# Patient Record
Sex: Female | Born: 1960 | Race: White | Hispanic: No | Marital: Married | Smoking: Never smoker
Health system: Southern US, Community
[De-identification: ages and names within clinical notes are randomized; demographics above are authoritative.]

## PROBLEM LIST (undated history)

## (undated) DIAGNOSIS — D352 Benign neoplasm of pituitary gland: Secondary | ICD-10-CM

## (undated) DIAGNOSIS — M549 Dorsalgia, unspecified: Secondary | ICD-10-CM

## (undated) HISTORY — PX: BREAST BIOPSY: SHX20

---

## 2012-08-26 HISTORY — PX: BREAST BIOPSY: SHX20

## 2012-09-30 ENCOUNTER — Other Ambulatory Visit: Payer: Self-pay | Admitting: Family Medicine

## 2012-09-30 DIAGNOSIS — Z1231 Encounter for screening mammogram for malignant neoplasm of breast: Secondary | ICD-10-CM

## 2012-10-29 ENCOUNTER — Ambulatory Visit
Admission: RE | Admit: 2012-10-29 | Discharge: 2012-10-29 | Disposition: A | Discharge status: Home / Self Care | Payer: 59 | Source: Ambulatory Visit | Attending: Family Medicine | Admitting: Family Medicine

## 2012-10-29 DIAGNOSIS — Z1231 Encounter for screening mammogram for malignant neoplasm of breast: Secondary | ICD-10-CM

## 2012-11-03 ENCOUNTER — Other Ambulatory Visit: Payer: Self-pay | Admitting: Family Medicine

## 2012-11-03 DIAGNOSIS — R928 Other abnormal and inconclusive findings on diagnostic imaging of breast: Secondary | ICD-10-CM

## 2012-11-12 ENCOUNTER — Ambulatory Visit
Admission: RE | Admit: 2012-11-12 | Discharge: 2012-11-12 | Disposition: A | Discharge status: Home / Self Care | Payer: 59 | Source: Ambulatory Visit | Attending: Family Medicine | Admitting: Family Medicine

## 2012-11-12 DIAGNOSIS — R928 Other abnormal and inconclusive findings on diagnostic imaging of breast: Secondary | ICD-10-CM

## 2013-04-16 ENCOUNTER — Other Ambulatory Visit: Payer: Self-pay | Admitting: Family Medicine

## 2013-04-16 DIAGNOSIS — N6459 Other signs and symptoms in breast: Secondary | ICD-10-CM

## 2013-05-04 ENCOUNTER — Other Ambulatory Visit: Payer: Self-pay | Admitting: Family Medicine

## 2013-05-04 ENCOUNTER — Ambulatory Visit
Admission: RE | Admit: 2013-05-04 | Discharge: 2013-05-04 | Disposition: A | Discharge status: Home / Self Care | Payer: 59 | Source: Ambulatory Visit | Attending: Family Medicine | Admitting: Family Medicine

## 2013-05-04 DIAGNOSIS — N6459 Other signs and symptoms in breast: Secondary | ICD-10-CM

## 2013-05-07 ENCOUNTER — Ambulatory Visit
Admission: RE | Admit: 2013-05-07 | Discharge: 2013-05-07 | Disposition: A | Discharge status: Home / Self Care | Payer: 59 | Source: Ambulatory Visit | Attending: Family Medicine | Admitting: Family Medicine

## 2013-05-07 ENCOUNTER — Other Ambulatory Visit: Payer: Self-pay | Admitting: Family Medicine

## 2013-05-07 DIAGNOSIS — N6459 Other signs and symptoms in breast: Secondary | ICD-10-CM

## 2013-05-14 ENCOUNTER — Other Ambulatory Visit: Payer: 59

## 2014-05-13 ENCOUNTER — Emergency Department (HOSPITAL_BASED_OUTPATIENT_CLINIC_OR_DEPARTMENT_OTHER)
Admission: EM | Admit: 2014-05-13 | Discharge: 2014-05-13 | Disposition: A | Discharge status: Home / Self Care | Payer: 59 | Attending: Emergency Medicine | Admitting: Emergency Medicine

## 2014-05-13 ENCOUNTER — Encounter (HOSPITAL_BASED_OUTPATIENT_CLINIC_OR_DEPARTMENT_OTHER): Payer: Self-pay | Admitting: Emergency Medicine

## 2014-05-13 DIAGNOSIS — T424X5A Adverse effect of benzodiazepines, initial encounter: Secondary | ICD-10-CM | POA: Diagnosis not present

## 2014-05-13 DIAGNOSIS — T450X5A Adverse effect of antiallergic and antiemetic drugs, initial encounter: Secondary | ICD-10-CM | POA: Insufficient documentation

## 2014-05-13 DIAGNOSIS — T7840XA Allergy, unspecified, initial encounter: Secondary | ICD-10-CM

## 2014-05-13 DIAGNOSIS — L299 Pruritus, unspecified: Secondary | ICD-10-CM | POA: Insufficient documentation

## 2014-05-13 DIAGNOSIS — Z79899 Other long term (current) drug therapy: Secondary | ICD-10-CM | POA: Insufficient documentation

## 2014-05-13 HISTORY — DX: Dorsalgia, unspecified: M54.9

## 2014-05-13 HISTORY — DX: Benign neoplasm of pituitary gland: D35.2

## 2014-05-13 MED ORDER — FAMOTIDINE 20 MG PO TABS: 20.0000 mg | ORAL_TABLET | Freq: Two times a day (BID) | ORAL | Status: AC

## 2014-05-13 NOTE — ED Notes (Signed)
Patient states she was at the sedation dentist office for an appointment at 0700.  States she was given oral medication prior to the beginning of there appointment (Triazolam & Hydroxyzine).  States she was then given IV sedation of of midazolam 1 mg x 2, followed by diazepam 5mg  at 0745.  At [place], pt developed itching and was given benadryl 50mg .  Dental procedure was continued with the use of carbocaine until 0843, when the itching intensified and pt was treated with epinephrine 0.3mg  IM, then followed by hydrocortisone 10mg  iv.  Patient is drowsy, oriented x 4, denies pain, and states that the itching is greatly improved.

## 2014-05-13 NOTE — ED Notes (Signed)
Per EMS:  Pt had reaction to valium at dentist office.  Per dentist, pt had decreased respirations and decreased sats at office.  Pt given Benadryl, Epinephrine, and Hydrocortisone in office.  No airway impairment.

## 2014-05-13 NOTE — ED Provider Notes (Signed)
CSN: [id number]     Arrival date & time 05/13/14  0814 History   First MD Initiated Contact with Patient 05/13/14 1012     Chief Complaint  Patient presents with  . Allergic Reaction     (Consider location/radiation/quality/duration/timing/severity/associated sxs/prior Treatment) Patient is a 53 y.o. female presenting with allergic reaction. The history is provided by the patient, the EMS personnel and the spouse.  Allergic Reaction Presenting symptoms: no difficulty swallowing and no rash    patient brought in by EMS from sedation data office. The patient was undergoing that procedure root canal and preparation of a tooth for crown. Patient started to have itching there've was no rash no tongue lip swelling no trouble breathing. Dentist administered hydrocortisone Benadryl and epinephrine. Patient no longer has any itching. Patient denies any trouble breathing. Patient is still slightly sedated. Patient's husband is with her.  Past Medical History  Diagnosis Date  . Pituitary adenoma   . Back pain    History reviewed. No pertinent past surgical history. No family history on file. History  Substance Use Topics  . Smoking status: Never Smoker   . Smokeless tobacco: Not on file  . Alcohol Use: Yes     Comment: occassionally   OB History   Grav Para Term Preterm Abortions TAB SAB Ect Mult Living                 Review of Systems  Constitutional: Negative for fever.  HENT: Negative for congestion and trouble swallowing.   Eyes: Negative for redness.  Respiratory: Negative for shortness of breath.   Cardiovascular: Negative for chest pain.  Gastrointestinal: Negative for nausea, vomiting and abdominal pain.  Skin: Negative for rash.  Neurological: Negative for headaches.  Psychiatric/Behavioral: Negative for confusion.      Allergies  Valium  Home Medications   Prior to Admission medications   Medication Sig Start Date End Date Taking? Authorizing Provider   cabergoline (DOSTINEX) 0.5 MG tablet Take 0.25 mg by mouth 2 (two) times a week.   Yes Historical Provider, MD  famotidine (PEPCID) 20 MG tablet Take 1 tablet (20 mg total) by mouth 2 (two) times daily. 05/13/14   Fredia Sorrow, MD   BP 138/70  Pulse 80  Temp(Src) 97.8 F (36.6 C) (Oral)  Resp 16  Ht 5\' 7"  (1.702 m)  Wt 199 lb (90.266 kg)  BMI 31.16 kg/m2  SpO2 96%  LMP 04/15/2014 Physical Exam  Nursing note and vitals reviewed. Constitutional: She is oriented to person, place, and time. She appears well-developed and well-nourished. No distress.  HENT:  Head: Normocephalic and atraumatic.  Mouth/Throat: Oropharynx is clear and moist. No oropharyngeal exudate.  Eyes: Conjunctivae and EOM are normal. Pupils are equal, round, and reactive to light.  Neck: Normal range of motion. Neck supple.  Cardiovascular: Normal rate, regular rhythm and normal heart sounds.   No murmur heard. Pulmonary/Chest: Effort normal and breath sounds normal.  Abdominal: Soft. Bowel sounds are normal. There is no tenderness.  Musculoskeletal: Normal range of motion. She exhibits no edema.  Neurological: She is oriented to person, place, and time. No cranial nerve deficit. She exhibits normal muscle tone. Coordination normal.  Mild sedation however will wake up and answer all questions appropriately. Will follow all commands. Eyes open spontaneously. Glasgow coma scale would be 15.  Skin: Skin is warm. No rash noted. No erythema.    ED Course  Procedures (including critical care time) Labs Review Labs Reviewed - No data to display  Imaging Review No results found.   EKG Interpretation None      MDM   Final diagnoses:  Allergic reaction, initial encounter    Patient referred in from sedation dentist office. Patient was having a root canal and the tooth prepped for crown. Patient started itching. Patient was undergoing some IV sedation had the basilar and had diazepam also had.Trimox Lam and  hydroxyzine. Not sure exactly what caused your reaction. There never was any hives but never was any tongue or lip swelling. There never was any significant trouble breathing.  Appropriately the dentist administered Benadryl and hydrocortisone in the office IV. Patient also received epinephrine. Patient will be continued on Pepcid and Benadryl.  Clinically sounds as if it was a mild allergic reaction to what specific medication is not clear.    Fredia Sorrow, MD 05/13/14 [phone]

## 2014-05-13 NOTE — Discharge Instructions (Signed)
Take Benadryl 25 mg every 6 hours for the next 24 hours. Take the Pepcid twice a day as prescribed. Follow back up with your dentist as scheduled for the rest of the dental work. Return here for any tongue swelling lip swelling trouble breathing any worsening symptoms at all.

## 2014-11-10 ENCOUNTER — Other Ambulatory Visit: Payer: Self-pay | Admitting: Obstetrics and Gynecology

## 2014-11-11 LAB — CYTOLOGY - PAP

## 2018-06-04 ENCOUNTER — Other Ambulatory Visit: Payer: Self-pay | Admitting: Obstetrics and Gynecology

## 2018-06-04 DIAGNOSIS — N6459 Other signs and symptoms in breast: Secondary | ICD-10-CM

## 2018-06-08 ENCOUNTER — Ambulatory Visit (HOSPITAL_BASED_OUTPATIENT_CLINIC_OR_DEPARTMENT_OTHER): Payer: 59

## 2018-06-08 ENCOUNTER — Ambulatory Visit
Admission: RE | Admit: 2018-06-08 | Discharge: 2018-06-08 | Disposition: A | Discharge status: Home / Self Care | Payer: 59 | Source: Ambulatory Visit | Attending: Obstetrics and Gynecology | Admitting: Obstetrics and Gynecology

## 2018-06-08 ENCOUNTER — Other Ambulatory Visit: Payer: Self-pay | Admitting: Obstetrics and Gynecology

## 2018-06-08 DIAGNOSIS — N6459 Other signs and symptoms in breast: Secondary | ICD-10-CM

## 2019-08-24 ENCOUNTER — Other Ambulatory Visit: Payer: Self-pay | Admitting: Obstetrics and Gynecology

## 2019-08-24 DIAGNOSIS — N6453 Retraction of nipple: Secondary | ICD-10-CM

## 2020-08-10 ENCOUNTER — Other Ambulatory Visit: Payer: Self-pay | Admitting: Obstetrics and Gynecology

## 2020-08-10 DIAGNOSIS — N644 Mastodynia: Secondary | ICD-10-CM

## 2020-09-21 ENCOUNTER — Ambulatory Visit
Admission: RE | Admit: 2020-09-21 | Discharge: 2020-09-21 | Disposition: A | Discharge status: Home / Self Care | Payer: 59 | Source: Ambulatory Visit | Attending: Obstetrics and Gynecology | Admitting: Obstetrics and Gynecology

## 2020-09-21 ENCOUNTER — Other Ambulatory Visit: Payer: Self-pay

## 2020-09-21 DIAGNOSIS — N644 Mastodynia: Secondary | ICD-10-CM

## 2022-03-28 IMAGING — MG DIGITAL DIAGNOSTIC BILAT W/ TOMO W/ CAD
6 of 10 series · 6 of 30 positions shown · non-contrast
Comparison: Previous exam(s).

CLINICAL DATA: Patient presents with diffuse inferior and lateral
right breast pain and also reports a lump along the inferior aspect
of the right breast. She has bilateral nipple retraction which she
believes has increased. No reported nipple discharge.

EXAM:
DIGITAL DIAGNOSTIC BILATERAL MAMMOGRAM WITH CAD AND TOMOSYNTHESIS
ULTRASOUND RIGHT BREAST
TECHNIQUE: Bilateral digital diagnostic mammography and breast tomosynthesis
was performed. Digital images of the breasts were evaluated with
computer-aided detection. Targeted ultrasound examination of the
Right breast was performed.

[L MLO synth-2D]
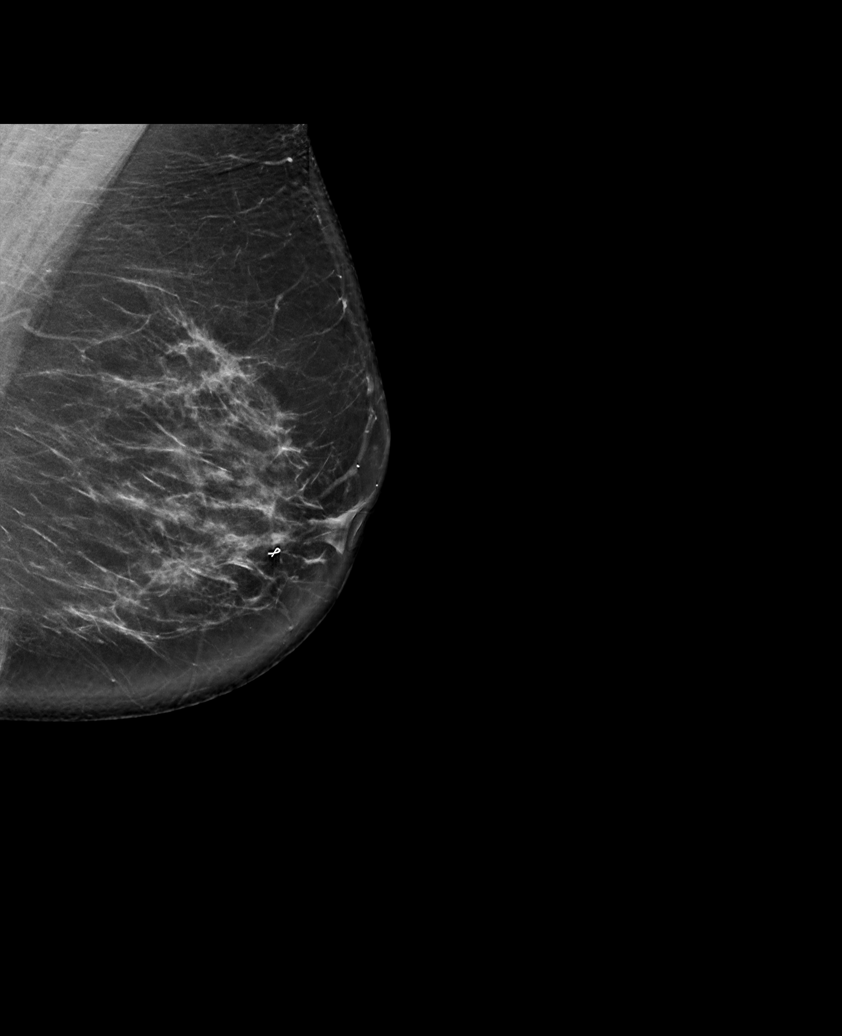

[R TAN synth-2D]
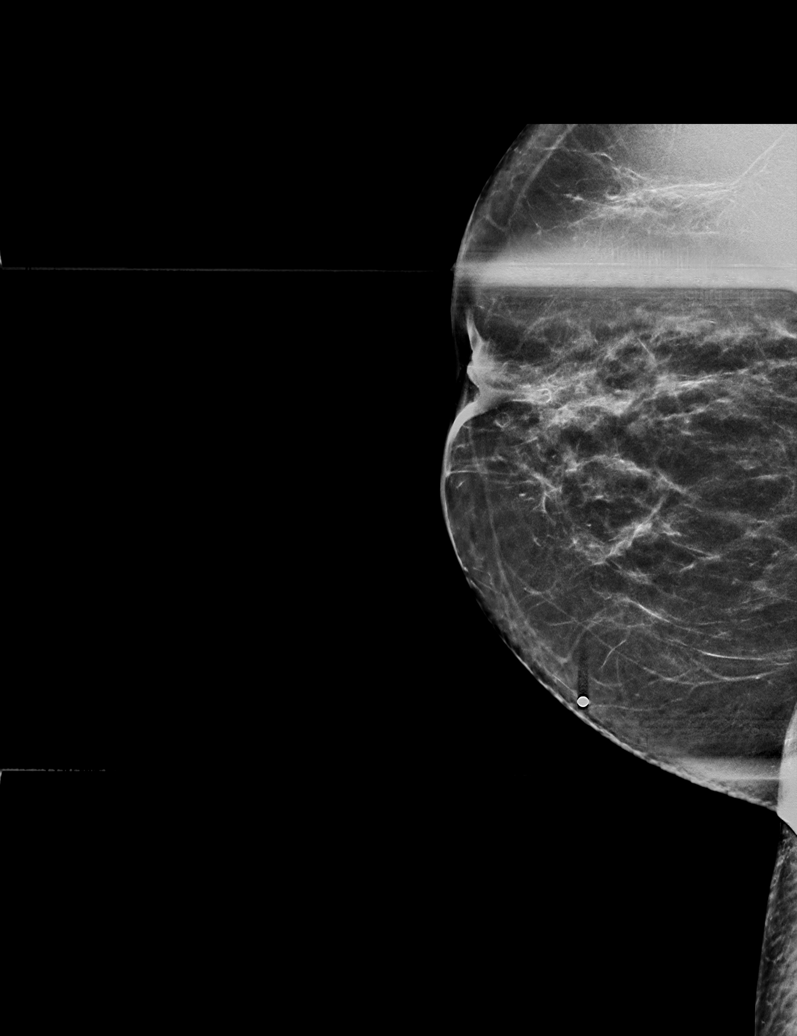

[L CC synth-2D]
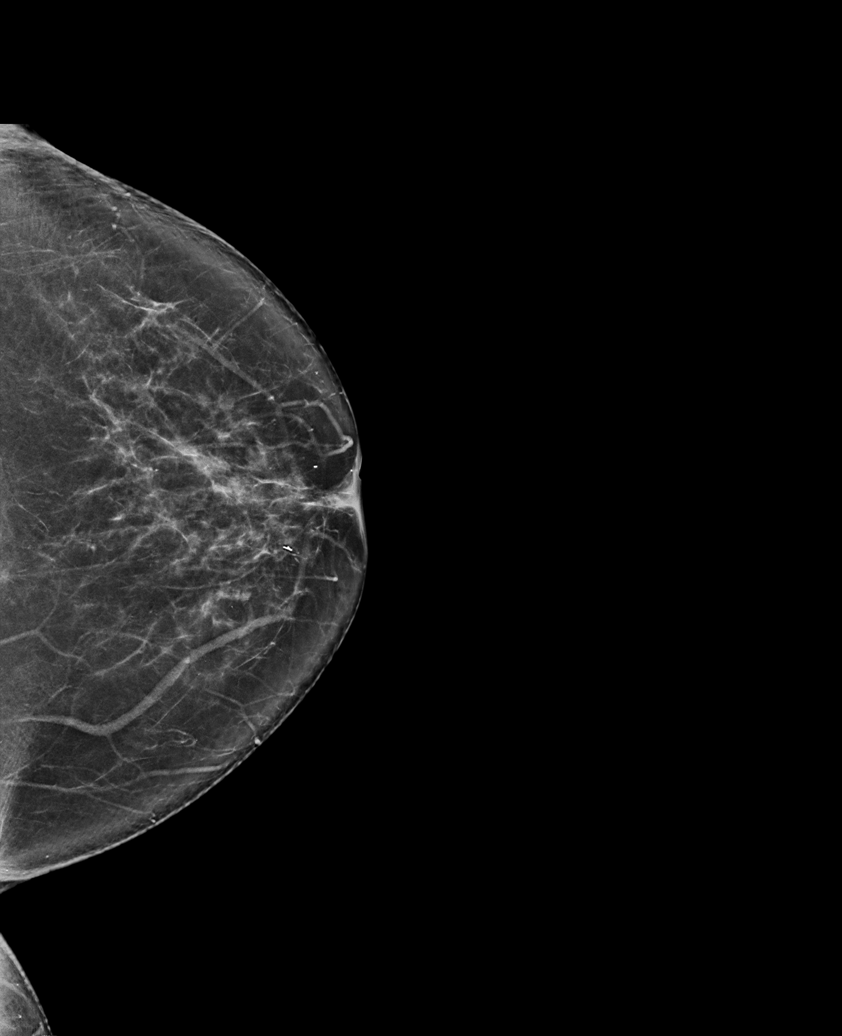

[R CC synth-2D]
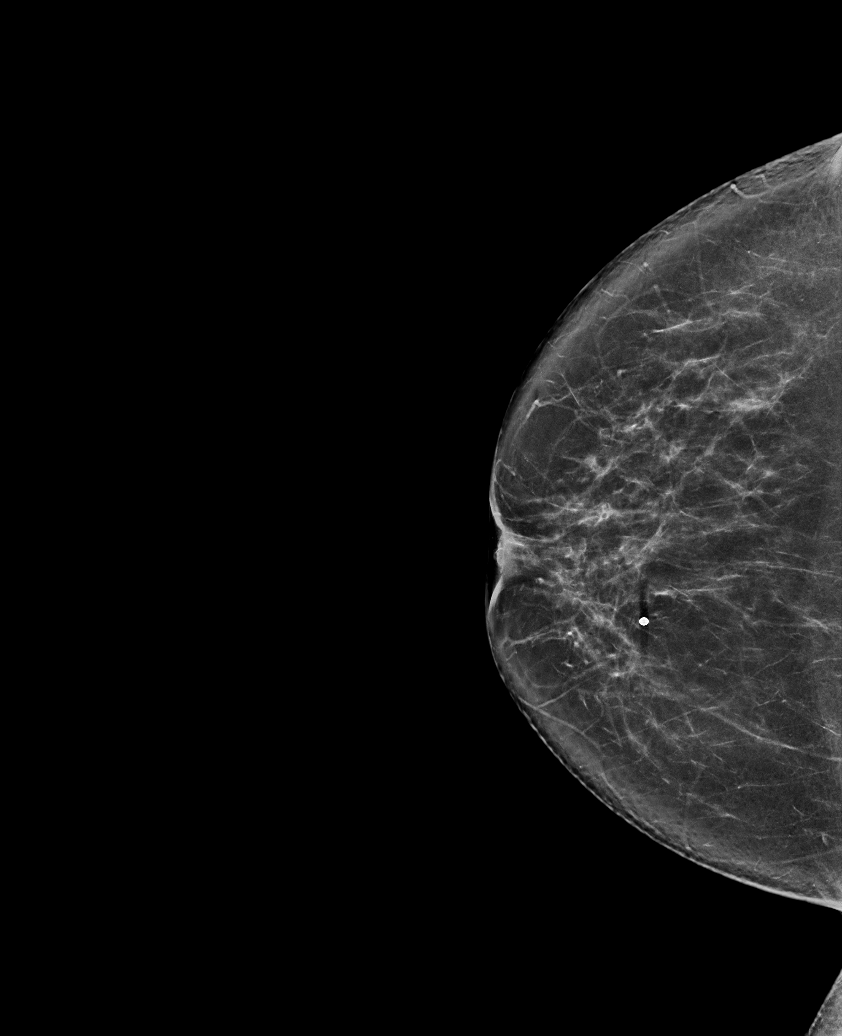

[R MLO synth-2D]
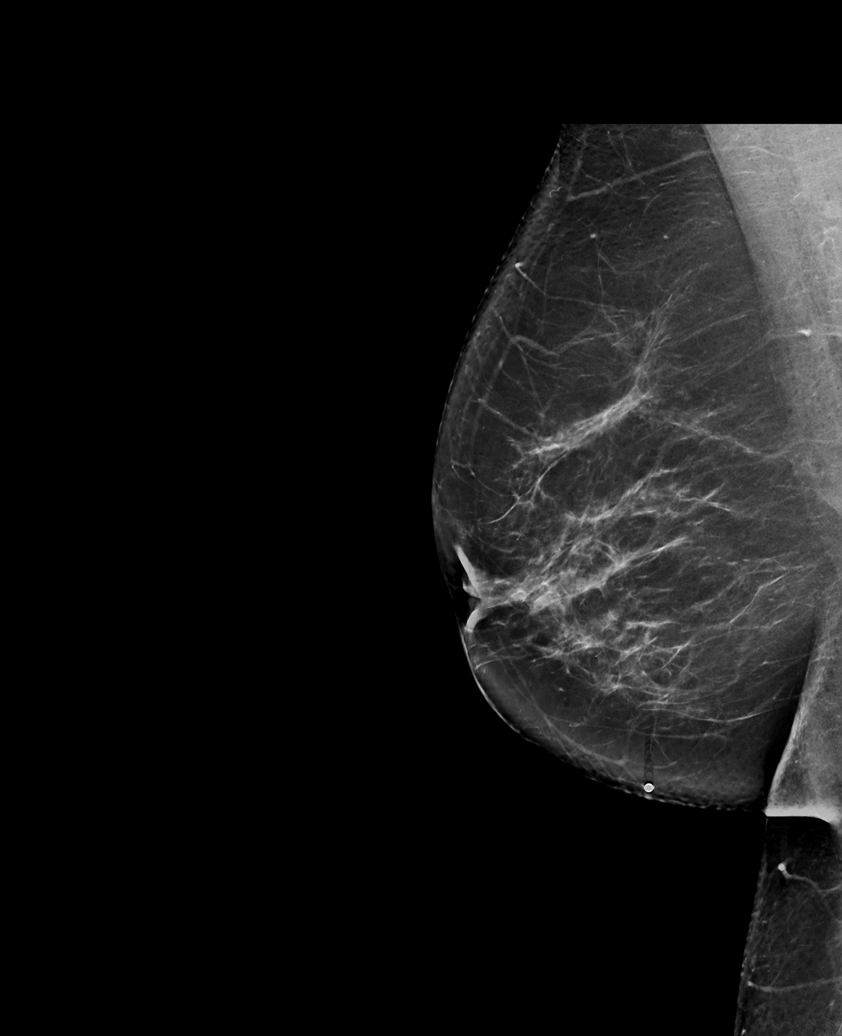

[R MLO tomo · tomo slice 46/91.0]
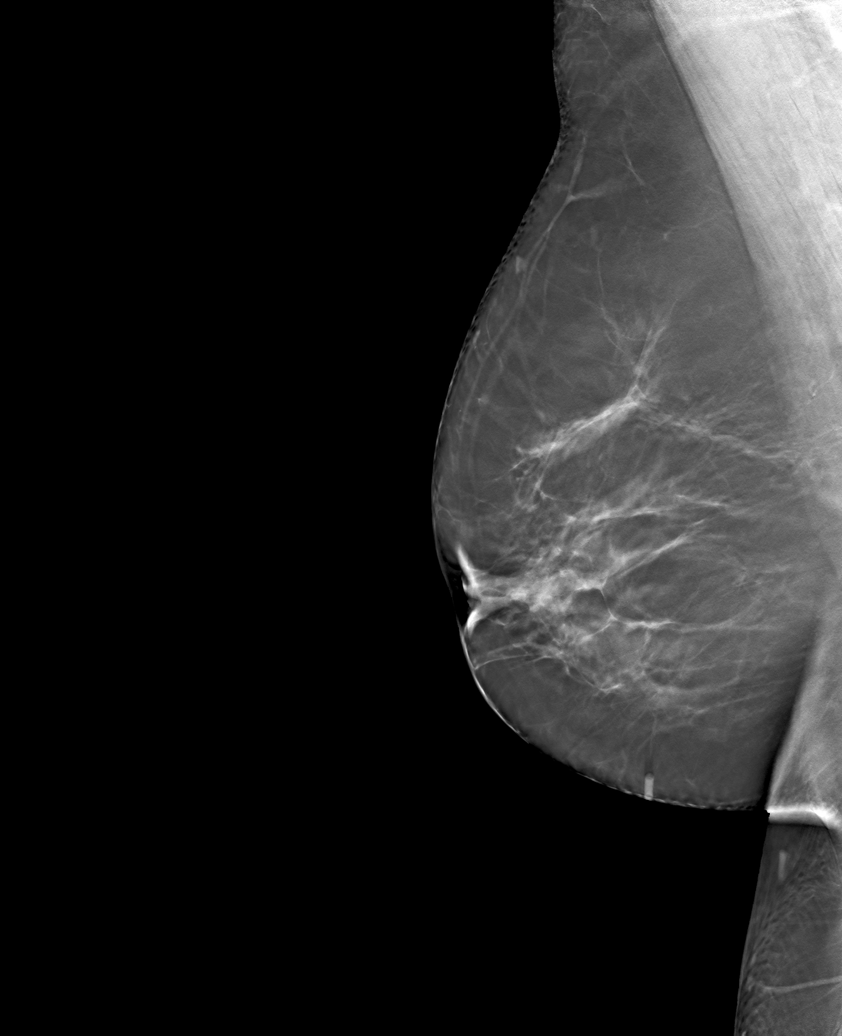

[6 of 30 positions shown; findings below may reference images not displayed]

ACR Breast Density Category b: There are scattered areas of
fibroglandular density.
FINDINGS: Two well-defined small oil cysts are noted in the retroareolar right
breast. There are no other masses, no areas of architectural
distortion and no suspicious calcifications. A stable ribbon shaped
biopsy clip lies in the retroareolar left breast. There has been no
mammographic change.

On physical exam, no mass is palpated along the inferior right
breast. Retroareolar masses.

Targeted ultrasound is performed, showing a small oil cyst in the
retroareolar right breast measuring 3 mm. Normal tissue is
visualized along the inferior right breast. No mass or suspicious
lesion.
IMPRESSION: 1. No evidence of breast malignancy.
2. Small benign oil cyst in the retroareolar right breast.

RECOMMENDATION:
Screening mammogram in one year.(Code:[id number])

I have discussed the findings and recommendations with the patient.
If applicable, a reminder letter will be sent to the patient
regarding the next appointment.

BI-RADS CATEGORY  2: Benign.
